# Patient Record
Sex: Female | Born: 2000 | Race: Black or African American | Hispanic: No | Marital: Single
Health system: Southern US, Community
[De-identification: ages and names within clinical notes are randomized; demographics above are authoritative.]

## PROBLEM LIST (undated history)

## (undated) HISTORY — PX: TONSILLECTOMY: SUR1361

---
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
## 2019-07-19 IMAGING — DX DG HAND COMPLETE 3+V*R*
3 series · 3 of 3 positions shown · non-contrast
Comparison: None.

CLINICAL DATA: Patient fell onto the right hand yesterday.
Difficulty moving thumb and heard a pop.

EXAM:
RIGHT HAND - COMPLETE 3+ VIEW

[hand pa]
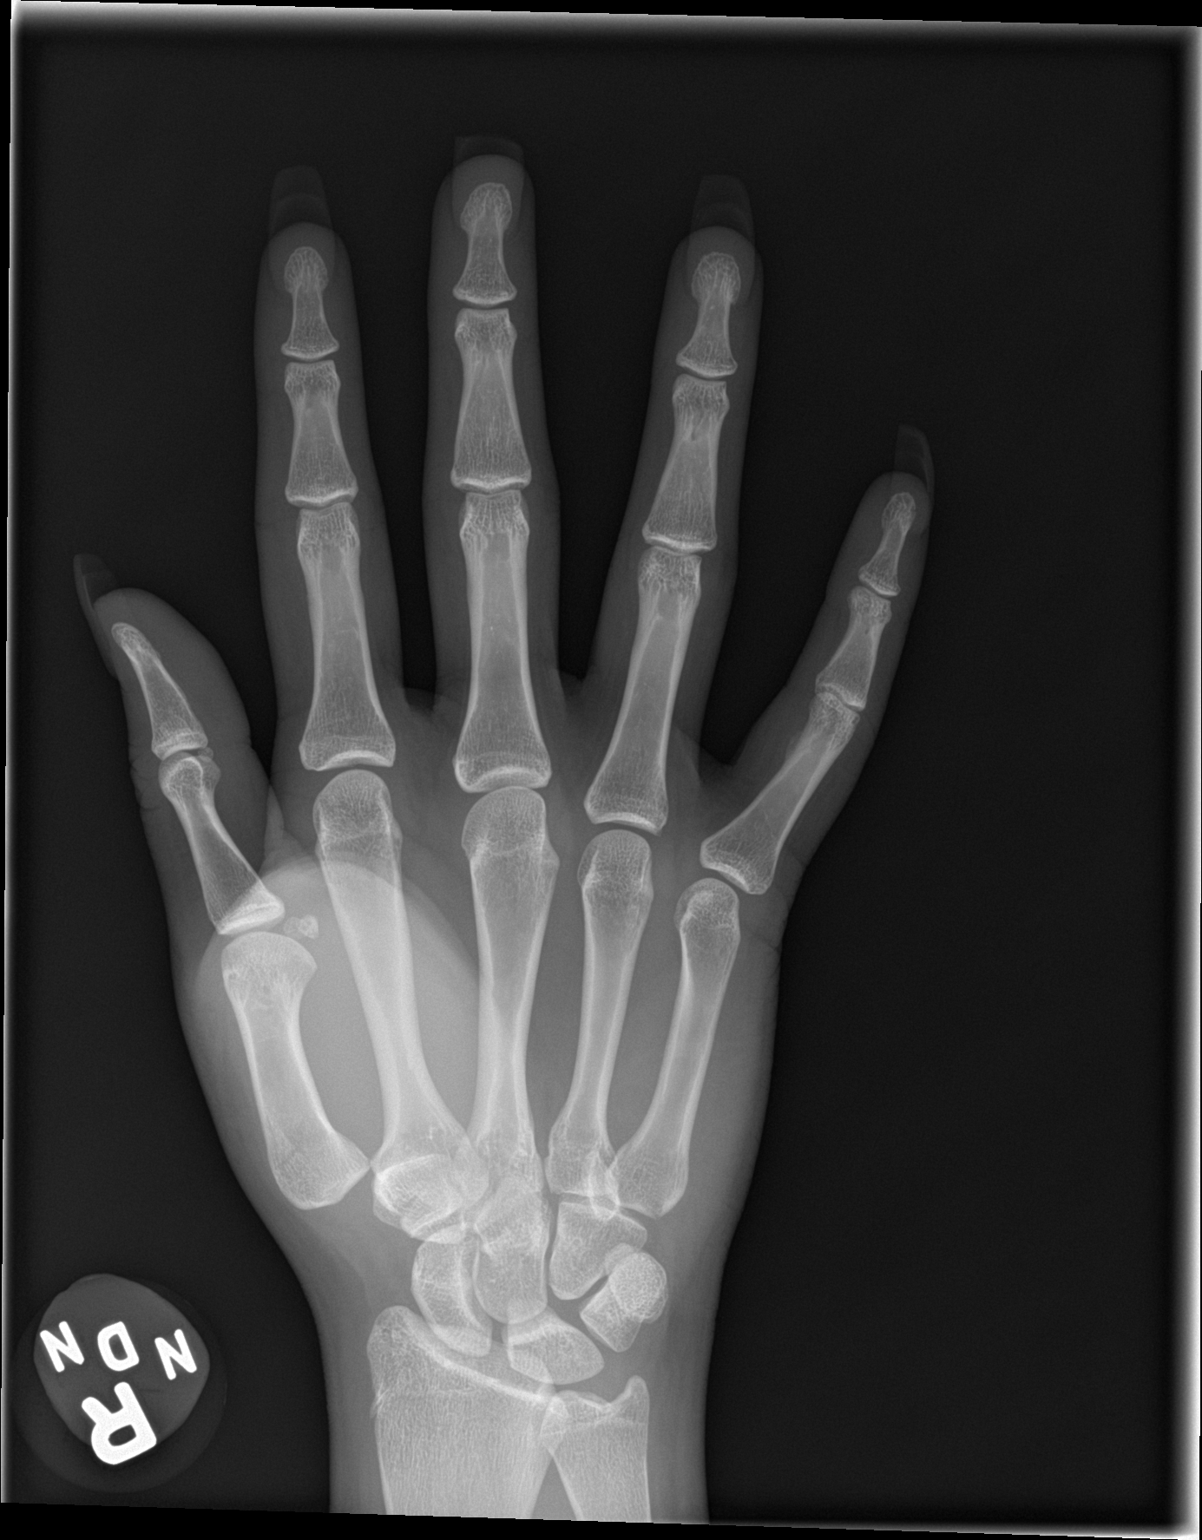

[hand obl]
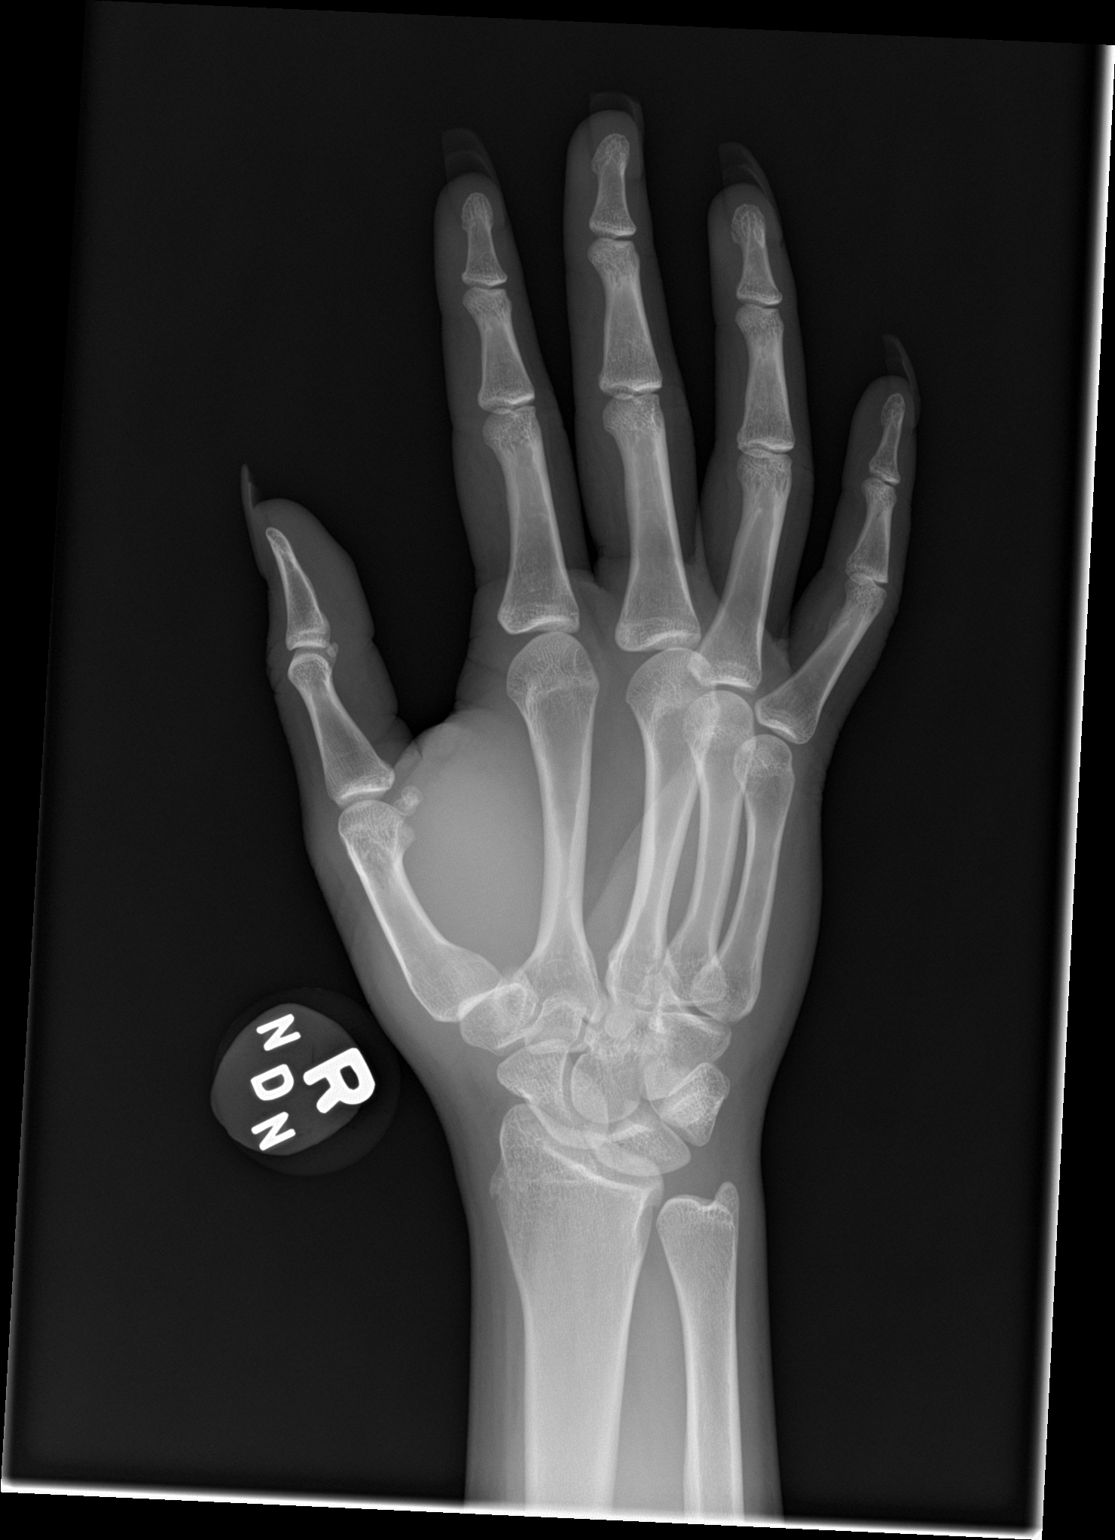

[hand lat]
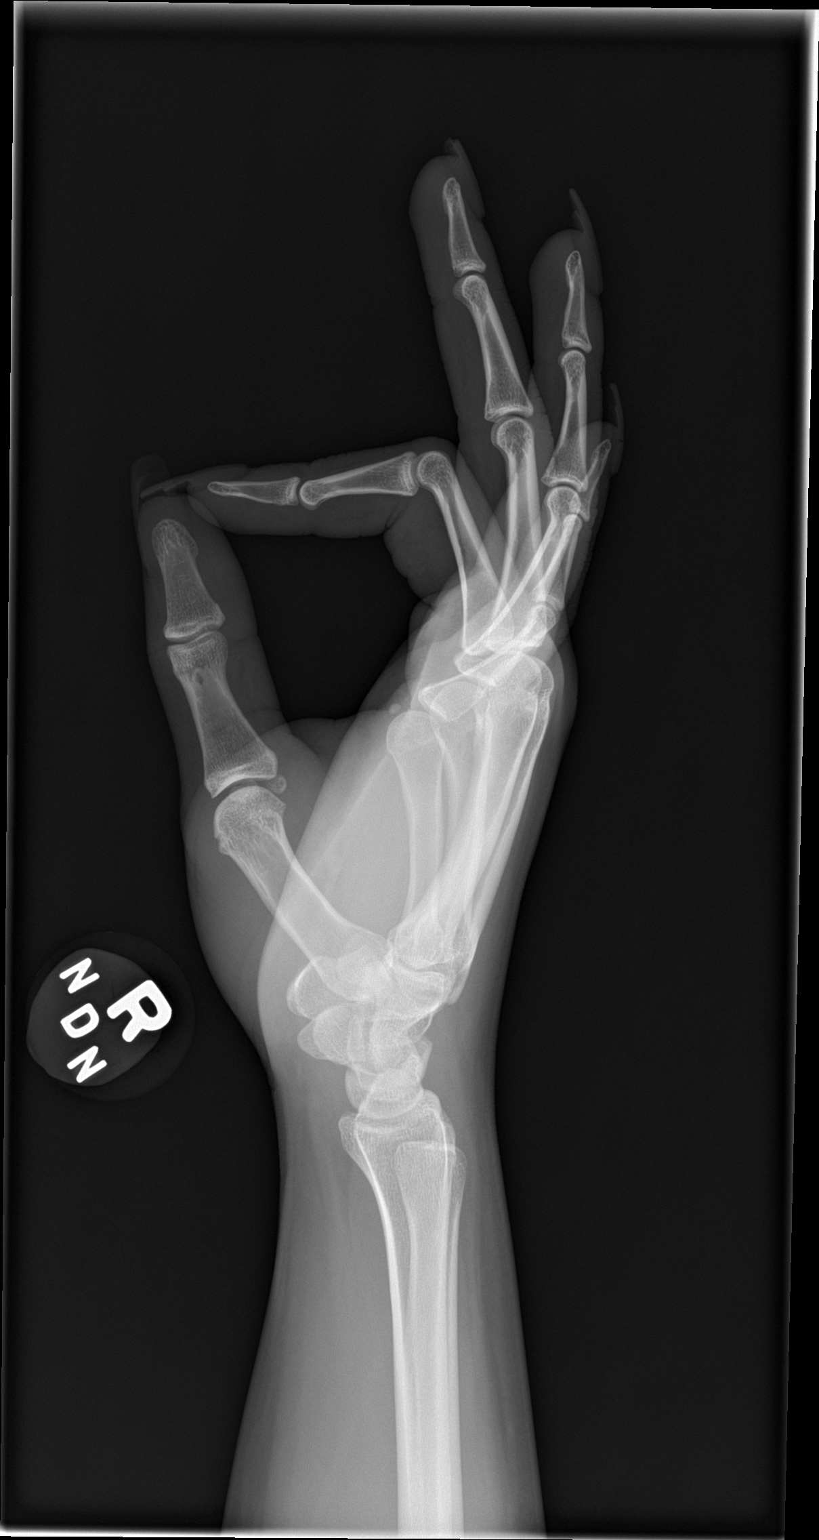

[3 of 3 positions shown; findings below may reference images not displayed]

FINDINGS: Vague linear lucency across the base of the right first metacarpal
bone could represent a nondisplaced fracture. No dislocation. No
other focal bone lesions identified. Soft tissues are unremarkable.
IMPRESSION: Linear lucency across the base of the first metacarpal bone may
represent a nondisplaced fracture.

## 2022-06-03 ENCOUNTER — Emergency Department (HOSPITAL_BASED_OUTPATIENT_CLINIC_OR_DEPARTMENT_OTHER)
Admission: EM | Admit: 2022-06-03 | Discharge: 2022-06-03 | Disposition: A | Discharge status: Home / Self Care | Payer: BC Managed Care – PPO | Attending: Emergency Medicine | Admitting: Emergency Medicine

## 2022-06-03 ENCOUNTER — Other Ambulatory Visit: Payer: Self-pay

## 2022-06-03 ENCOUNTER — Emergency Department (HOSPITAL_BASED_OUTPATIENT_CLINIC_OR_DEPARTMENT_OTHER): Payer: BC Managed Care – PPO

## 2022-06-03 ENCOUNTER — Encounter (HOSPITAL_BASED_OUTPATIENT_CLINIC_OR_DEPARTMENT_OTHER): Payer: Self-pay | Admitting: Emergency Medicine

## 2022-06-03 DIAGNOSIS — S9031XA Contusion of right foot, initial encounter: Secondary | ICD-10-CM | POA: Diagnosis not present

## 2022-06-03 DIAGNOSIS — M79671 Pain in right foot: Secondary | ICD-10-CM | POA: Diagnosis present

## 2022-06-03 DIAGNOSIS — W208XXA Other cause of strike by thrown, projected or falling object, initial encounter: Secondary | ICD-10-CM | POA: Diagnosis not present

## 2022-06-03 NOTE — Discharge Instructions (Signed)
Motrin Tylenol as needed as directed.  Can apply ice for 20 minutes at a time.  Recheck with your doctor if not improving after 1 week.

## 2022-06-03 NOTE — ED Triage Notes (Signed)
Pt c/o RT foot pain s/p a dining table falling on it today

## 2022-06-03 NOTE — ED Provider Notes (Signed)
[facility] EMERGENCY DEPARTMENT Provider Note   CSN: [id number] Arrival date & time: 06/03/22  1716     History  Chief Complaint  Patient presents with   Foot Injury    Vanessa Perry is a 21 y.o. female.  21 year old female in her right foot at the first MTP.  Patient was moving a table, was barefoot, the table dropped and landed on her foot.  No pain at rest, has pain with bearing weight.  No other injuries, complaints, concerns.       Home Medications Prior to Admission medications   Medication Sig Start Date End Date Taking? Authorizing Provider  HYDROcodone-acetaminophen (NORCO) 5-325 MG tablet Take 1 tablet by mouth every 4 (four) hours as needed for severe pain. 12/08/17   Pricilla Loveless, MD      Allergies    Patient has no known allergies.    Review of Systems   Review of Systems Negative except as per HPI Physical Exam Updated Vital Signs BP 117/70 (BP Location: Right Arm)   Pulse 83   Temp 98.2 F (36.8 C) (Oral)   Resp 16   Ht 6' (1.829 m)   Wt 97.5 kg   LMP 05/17/2022   SpO2 99%   BMI 29.16 kg/m  Physical Exam Vitals and nursing note reviewed.  Constitutional:      General: She is not in acute distress.    Appearance: She is well-developed. She is not diaphoretic.  HENT:     Head: Normocephalic and atraumatic.  Cardiovascular:     Pulses: Normal pulses.  Pulmonary:     Effort: Pulmonary effort is normal.  Musculoskeletal:        General: Tenderness present. No swelling or deformity. Normal range of motion.     Comments: TTP right 1st MTP, skin intact, cap refill brisk, motor.sensor intact   Skin:    General: Skin is warm and dry.     Findings: No erythema or rash.  Neurological:     Mental Status: She is alert and oriented to person, place, and time.     Sensory: No sensory deficit.     Motor: No weakness.     Gait: Gait normal.  Psychiatric:        Behavior: Behavior normal.     ED Results / Procedures / Treatments    Labs (all labs ordered are listed, but only abnormal results are displayed) Labs Reviewed - No data to display  EKG None  Radiology DG Foot Complete Right  Result Date: 06/03/2022 CLINICAL DATA:  Patient dropped a table on the top of her right foot while moving furniture today EXAM: RIGHT FOOT COMPLETE - 3 VIEW COMPARISON:  None Available. FINDINGS: There is no evidence of fracture or dislocation. There is no evidence of arthropathy or other focal bone abnormality. Incidentally noted biphalangeal little toe. Soft tissues are unremarkable. IMPRESSION: No acute fracture or dislocation. Electronically Signed   By: Agustin Cree M.D.   On: 06/03/2022 17:42    Procedures Procedures    Medications Ordered in ED Medications - No data to display  ED Course/ Medical Decision Making/ A&P                           Medical Decision Making Amount and/or Complexity of Data Reviewed Radiology: ordered.   21 year old female resents with concern for right foot pain after dropping a table on her barefoot earlier today.  Reports pain at the right  first MTP, has minimal swelling and ecchymosis in this area.  Skin is intact.  Sensorimotor intact.  Brisk capillary refill present distal to the injury.  X-ray of the right foot as ordered interpreted by myself is negative for acute bony injury.  Agree with radiologist interpretation.  Recommend supportive shoe, Motrin, Tylenol, ice and elevate for 20 minutes at a time.        Final Clinical Impression(s) / ED Diagnoses Final diagnoses:  Contusion of right foot, initial encounter    Rx / DC Orders ED Discharge Orders     None         Jeannie Fend, PA-C 06/03/22 1846    Lonell Grandchild, MD 06/03/22 2311
